# Patient Record
Sex: Male | Born: 1990 | Race: Black or African American | Hispanic: No | Marital: Single | Smoking: Never smoker
Health system: Southern US, Community
[De-identification: ages and names within clinical notes are randomized; demographics above are authoritative.]

## PROBLEM LIST (undated history)

## (undated) DIAGNOSIS — R55 Syncope and collapse: Principal | ICD-10-CM

## (undated) DIAGNOSIS — J300 Vasomotor rhinitis: Secondary | ICD-10-CM

## (undated) DIAGNOSIS — E669 Obesity, unspecified: Secondary | ICD-10-CM

## (undated) DIAGNOSIS — I440 Atrioventricular block, first degree: Secondary | ICD-10-CM

## (undated) DIAGNOSIS — R001 Bradycardia, unspecified: Secondary | ICD-10-CM

## (undated) HISTORY — DX: Syncope and collapse: R55

## (undated) HISTORY — DX: Atrioventricular block, first degree: I44.0

## (undated) HISTORY — PX: ADENOIDECTOMY: SUR15

## (undated) HISTORY — DX: Vasomotor rhinitis: J30.0

## (undated) HISTORY — DX: Obesity, unspecified: E66.9

## (undated) HISTORY — DX: Bradycardia, unspecified: R00.1

---
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
## 2012-01-03 ENCOUNTER — Telehealth: Payer: Self-pay | Admitting: Medical

## 2012-01-03 NOTE — Telephone Encounter (Signed)
Its hard to answer that question, but given the concern, I would have them come in to discuss and possibly do STD screening.   Depending upon what skin contact and where, the answer could vary.   OV please.

## 2012-01-04 ENCOUNTER — Ambulatory Visit: Payer: BC Managed Care – PPO | Admitting: Medical

## 2012-01-04 NOTE — Telephone Encounter (Signed)
PT MADE APPT

## 2012-01-14 ENCOUNTER — Ambulatory Visit (INDEPENDENT_AMBULATORY_CARE_PROVIDER_SITE_OTHER): Payer: BC Managed Care – PPO | Admitting: Medical

## 2012-01-14 ENCOUNTER — Encounter: Payer: Self-pay | Admitting: Medical

## 2012-01-14 VITALS — BP 110/70 | HR 68 | Temp 97.4°F | Resp 16 | Wt 268.0 lb

## 2012-01-14 DIAGNOSIS — Z113 Encounter for screening for infections with a predominantly sexual mode of transmission: Secondary | ICD-10-CM

## 2012-01-14 DIAGNOSIS — R55 Syncope and collapse: Secondary | ICD-10-CM

## 2012-01-14 NOTE — Progress Notes (Signed)
  Subjective:   HPI  Shawn Dean is a 21 y.o. male who presents for concern for STD screening.  He recently had sex for the first time.  He had sex on 4 different occasional and used condoms each time.  He is here for STD screening as he notes that vaginal fluid got on his body around the genitals.  He was using condom and the condom protected the penis.  He is mainly concerned about fluid that got on his lower abdomen.  He has never had STD screening before.  His partner was not a virgin.  Denies sores or open wounds. No other aggravating or relieving factors.    No other c/o.  The following portions of the patient's history were reviewed and updated as appropriate: allergies, current medications, past family history, past medical history, past social history, past surgical history and problem list.  Past Medical History  Diagnosis Date  . Allergic rhinitis   . Vasomotor rhinitis   . Obesity     No Known Allergies   Review of Systems Gen: no fever, chills, wt loss Skin: no rash GI: no NVD GU: negative ROS reviewed and was negative other than noted in HPI or above.    Objective:   Physical Exam  General appearance: alert, no distress, WD/WN Heart: RRR, normal S1, S2, no murmurs Lungs: CTA bilaterally, no wheezes, rhonchi, or rales Abdomen: +bs, soft, non tender, non distended, no masses, no hepatomegaly, no splenomegaly Pulses: 2+ symmetric, upper and lower extremities, normal cap refill GU: normal external genitalia, no mass or rash or lesion, no hernia, nontender   Assessment and Plan :     Encounter Diagnoses  Name Primary?  . Screening for STD (sexually transmitted disease) Yes  . Vasovagal episode     STD screening today - discussed screening tests, repeat HIV in 80mo, discussed safe sex, condom use, discussed different STDs and means of transmission.  Of note, a few minutes after taking his penile swab specimen, he mentioned feeling an unusual sensation  coming on and fainted.  I caught him on the way down and he became awake and conscious within seconds.  We eventually had him lie on the table.  After feeling better a while later, we had him eat and drink something.  He felt fine the rest of the visit.  Vitals fine.  He was discharged home with his mother who brought him today.

## 2012-01-15 LAB — GC/CHLAMYDIA PROBE AMP, GENITAL
Chlamydia, DNA Probe: NEGATIVE
GC Probe Amp, Genital: NEGATIVE

## 2012-01-15 LAB — RPR

## 2012-01-15 LAB — HIV ANTIBODY (ROUTINE TESTING W REFLEX): HIV: NONREACTIVE

## 2012-01-17 ENCOUNTER — Other Ambulatory Visit: Payer: Self-pay | Admitting: Medical

## 2012-01-17 MED ORDER — AMOXICILLIN 500 MG PO TABS: ORAL_TABLET | ORAL | Status: DC

## 2012-02-24 ENCOUNTER — Telehealth: Payer: Self-pay | Admitting: Medical

## 2012-02-25 ENCOUNTER — Ambulatory Visit: Payer: BC Managed Care – PPO | Admitting: Medical

## 2012-02-25 ENCOUNTER — Other Ambulatory Visit: Payer: Self-pay | Admitting: Medical

## 2012-02-25 MED ORDER — AMOXICILLIN 500 MG PO TABS: ORAL_TABLET | ORAL | Status: DC

## 2012-02-25 NOTE — Telephone Encounter (Signed)
LM

## 2013-03-02 ENCOUNTER — Encounter: Payer: Self-pay | Admitting: Medical

## 2013-03-02 ENCOUNTER — Ambulatory Visit (INDEPENDENT_AMBULATORY_CARE_PROVIDER_SITE_OTHER): Payer: BC Managed Care – PPO | Admitting: Medical

## 2013-03-02 VITALS — BP 110/80 | HR 84 | Temp 97.9°F | Resp 12 | Ht 71.0 in | Wt 310.0 lb

## 2013-03-02 DIAGNOSIS — H109 Unspecified conjunctivitis: Secondary | ICD-10-CM

## 2013-03-02 DIAGNOSIS — H7292 Unspecified perforation of tympanic membrane, left ear: Secondary | ICD-10-CM

## 2013-03-02 DIAGNOSIS — H7290 Unspecified perforation of tympanic membrane, unspecified ear: Secondary | ICD-10-CM

## 2013-03-02 DIAGNOSIS — H6690 Otitis media, unspecified, unspecified ear: Secondary | ICD-10-CM

## 2013-03-02 MED ORDER — ERYTHROMYCIN 5 MG/GM OP OINT: TOPICAL_OINTMENT | Freq: Four times a day (QID) | OPHTHALMIC | Status: DC

## 2013-03-02 MED ORDER — AMOXICILLIN 875 MG PO TABS: 875.0000 mg | ORAL_TABLET | Freq: Two times a day (BID) | ORAL | Status: DC

## 2013-03-02 NOTE — Progress Notes (Signed)
Subjective:    Shawn Dean is a 22 y.o. male who presents for evaluation of cold and eye c/o.  Has had nasal congestion and ear pressure for several days, but last few days with red eyes, eye swollen pus drainage, started with right, now in to left eye too.  Using nothing for symptoms . He works 2 jobs, Actuary, touches buggy handles, Ambulance person, etc. , where numerous other people touch.  His young niece had eye infection recently too.   He does have prior hx/o ear infection and tubes, but no prior pink eye.    ROS as in subjective  Objective: Filed Vitals:   03/02/13 1358  BP: 110/80  Pulse: 84  Temp: 97.9 F (36.6 C)  Resp: 12    General appearance: alert, no distress, WD/WN Eyes: right upper eyelid with swelling generalized, bilat conjunctiva with erythema, purulent discharge on the right, no stye, no FB, there is also crusting of eyelashes HENT: normocephalic, sclerae anicteric, right TM with effusion, left TM with effusion, slight erythema and small 2mm perforation anteriorly, nares with mild turbinated edema, purulent discharge slight erythema, pharynx normal Oral cavity: MMM, no lesions Neck: supple, no lymphadenopathy, no thyromegaly, no masses Lungs: CTA bilaterally, no wheezes, rhonchi, or rales  Assessment: Encounter Diagnoses  Name Primary?  . Conjunctivitis Yes  . Otitis media, bilateral   . Perforated tympanic membrane, left      Plan: conjunctiva - begin Emycin ointment for eyes, moist compresses, discussed hygiene, prevention, note given for work.   OM and small left perforated TM - begin amoxicillin oral, OTC Mucinex, increase water intake, and recheck 7-10 days. Handout given.

## 2013-03-02 NOTE — Patient Instructions (Addendum)
You have both a pink eye/conjunctivitis as well as ear infection today.  Begin Amoxicillin twice daily by mouth for 10 days for ear infection.     Begin erythromycin ointment, use a ribbon in each eye every 6 hours for a week.   WASH HAND frequently.  Recheck in 7-10 days.     Conjunctivitis Conjunctivitis is commonly called "pink eye." Conjunctivitis can be caused by bacterial or viral infection, allergies, or injuries. There is usually redness of the lining of the eye, itching, discomfort, and sometimes discharge. There may be deposits of matter along the eyelids. A viral infection usually causes a watery discharge, while a bacterial infection causes a yellowish, thick discharge. Pink eye is very contagious and spreads by direct contact. You may be given antibiotic eyedrops as part of your treatment. Before using your eye medicine, remove all drainage from the eye by washing gently with warm water and cotton balls. Continue to use the medication until you have awakened 2 mornings in a row without discharge from the eye. Do not rub your eye. This increases the irritation and helps spread infection. Use separate towels from other household members. Wash your hands with soap and water before and after touching your eyes. Use cold compresses to reduce pain and sunglasses to relieve irritation from light. Do not wear contact lenses or wear eye makeup until the infection is gone. SEEK MEDICAL CARE IF:   Your symptoms are not better after 3 days of treatment.  You have increased pain or trouble seeing.  The outer eyelids become very red or swollen. Document Released: 08/26/2004 Document Revised: 10/11/2011 Document Reviewed: 07/19/2005 [facility] Patient Information 2014 [place], [place].

## 2014-05-08 ENCOUNTER — Encounter: Payer: Self-pay | Admitting: Medical

## 2014-05-08 ENCOUNTER — Ambulatory Visit (INDEPENDENT_AMBULATORY_CARE_PROVIDER_SITE_OTHER): Payer: BC Managed Care – PPO | Admitting: Medical

## 2014-05-08 VITALS — BP 112/80 | HR 60 | Temp 98.4°F | Resp 18 | Wt 311.0 lb

## 2014-05-08 DIAGNOSIS — L739 Follicular disorder, unspecified: Secondary | ICD-10-CM

## 2014-05-08 DIAGNOSIS — Z23 Encounter for immunization: Secondary | ICD-10-CM

## 2014-05-08 MED ORDER — MUPIROCIN 2 % EX OINT: 1.0000 "application " | TOPICAL_OINTMENT | Freq: Three times a day (TID) | CUTANEOUS | Status: DC

## 2014-05-08 MED ORDER — CEPHALEXIN 500 MG PO CAPS: 500.0000 mg | ORAL_CAPSULE | Freq: Three times a day (TID) | ORAL | Status: DC

## 2014-05-08 NOTE — Progress Notes (Signed)
Subjective; Here for rash.  He reports few days ago clogged up a toilet with toilet paper . Had a small glove on but the nasty toilet water did contact his right forearm.  He washed his forearm off, but few hours later got some bumps and rash of right forearm that hasn't went away.  No fever, no nausea, no vomiting, no recent open wounds.  No other aggravating or relieving factors.  No other c/o.  Objective: Gen:wd, wn, nad Skin: right forearm with several raised slightly erythematous lesions, papules, 81mm diameter, no drainage, no fluctuance, there is some underlying mild patches of erythema Pulses of hand normal Sensation normal nontender arm  Assessment: Encounter Diagnoses  Name Primary?  . Folliculitis Yes  . Flu vaccine need    Plan; Folliculitis - he is UTD on tetanus and Hep B vaccines, begin Keflex oral and mupirocin ointment topically.   If new symptoms or not completely resolved in 10 days, then recheck.   Recheck sooner prn.  Counseled on the influenza virus vaccine.  Vaccine information sheet given.  Influenza vaccine given after consent obtained.

## 2014-07-29 ENCOUNTER — Encounter: Payer: Self-pay | Admitting: Medical

## 2014-07-29 ENCOUNTER — Ambulatory Visit (INDEPENDENT_AMBULATORY_CARE_PROVIDER_SITE_OTHER): Payer: BC Managed Care – PPO | Admitting: Medical

## 2014-07-29 VITALS — BP 100/70 | HR 63 | Temp 98.2°F | Wt 313.0 lb

## 2014-07-29 DIAGNOSIS — J343 Hypertrophy of nasal turbinates: Secondary | ICD-10-CM

## 2014-07-29 DIAGNOSIS — H109 Unspecified conjunctivitis: Secondary | ICD-10-CM

## 2014-07-29 MED ORDER — POLYMYXIN B-TRIMETHOPRIM 10000-0.1 UNIT/ML-% OP SOLN: 1.0000 [drp] | OPHTHALMIC | Status: DC

## 2014-07-29 NOTE — Progress Notes (Addendum)
Subjective Patient presents for evaluation of discharge, erythema and itching in the right eye. He has noticed the above symptoms for 1 day.  Onset was acute. Patient denies blurred vision, pain and photophobia. 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  doesn't wash hands frequently.   Objective: Gen: wd, wn, nad Eyes: PERRLA, EOMi, right eye with conjunctival injection, some crusting, left eye unremarkable Left nasal turbinates with hypertrophy and decreased patency hent - otherwise unremarkable   Assessment: Encounter Diagnoses  Name Primary?  . Conjunctivitis of right eye Yes  . Nasal turbinate hypertrophy    Plan: Discussed diagnosis of conjunctivitis/pink eye.  Advised that pink eye is very contagious and spreads by direct contact.  Discussed treatment including moist warm compresses, antibiotic drops, avoid rubbing eyes, do not wear contact lenses or makeup until infection is resolved.  Discussed prevention, hand washing, not rubbing eyes.  Call or return if worse or not improving.   Nasal turbinate hypertrophy - declined referral to ENT at this time.

## 2015-05-05 ENCOUNTER — Telehealth: Payer: Self-pay

## 2015-05-05 ENCOUNTER — Encounter: Payer: Self-pay | Admitting: Medical

## 2015-05-05 ENCOUNTER — Ambulatory Visit (INDEPENDENT_AMBULATORY_CARE_PROVIDER_SITE_OTHER): Payer: BLUE CROSS/BLUE SHIELD | Admitting: Medical

## 2015-05-05 VITALS — BP 100/68 | HR 60 | Temp 98.0°F | Wt 289.0 lb

## 2015-05-05 DIAGNOSIS — H1013 Acute atopic conjunctivitis, bilateral: Secondary | ICD-10-CM

## 2015-05-05 DIAGNOSIS — J309 Allergic rhinitis, unspecified: Principal | ICD-10-CM

## 2015-05-05 MED ORDER — OLOPATADINE HCL 0.2 % OP SOLN: 1.0000 [drp] | Freq: Two times a day (BID) | OPHTHALMIC | Status: DC

## 2015-05-05 MED ORDER — FEXOFENADINE HCL 180 MG PO TABS: 180.0000 mg | ORAL_TABLET | Freq: Every day | ORAL | Status: DC

## 2015-05-05 NOTE — Progress Notes (Signed)
Subjective: Chief Complaint  Patient presents with  . Pink eye    first noticed on Sunday,achey and runny eye. used some eye ointment from last time he had pink eye. asked about flu shot told him he may need to schedule that since he is here for a sick visit today   Here for pink/red eyes.   Started 1 days ago with red eyes.   Has had watery eyes, runny nose, congested nose for weeks.  He does note in the fall getting this pattern.  Thinks he has pink eye.   No sick contacts.  He had left over Emycin ointment from prior he put in left eye yesterday.  Wears contacts, but not currently using left contacts.   Left eye a little worse than right. No other aggravating or relieving factors. No other complaint.  Past Medical History  Diagnosis Date  . Allergic rhinitis   . Vasomotor rhinitis   . Obesity    ROS as in subjective    Objective: BP 100/68 mmHg  Pulse 60  Temp(Src) 98 F (36.7 C)  Wt 289 lb (131.09 kg)  Gen: wd, wn, nad Skin unremarkable bilat conjunctiva with erythema, but no drainage, no crusting Neck supple, nontender, no lymphadenopathy Nose with swollen turbinates, clear discharge, no erythema, pharynx normal Oral MMM, no lesions   Assessment: Encounter Diagnosis  Name Primary?  . Allergic conjunctivitis and rhinitis, bilateral Yes    Plan: Advised that symptoms and exam suggest allergy not infection.   Begin medications below, discussed hand washing and hygiene in general.  F/u if not improving.  Plan to c/t medication 1-2 months during fall allergy season.  Of note, he has cats in the home as well, other potential allergen triggers.    Shawn Dean was seen today for pink eye.  Diagnoses and all orders for this visit:  Allergic conjunctivitis and rhinitis, bilateral  Other orders -     Olopatadine HCl (PATADAY) 0.2 % SOLN; Apply 1 drop to eye 2 (two) times daily. -     fexofenadine (ALLEGRA) 180 MG tablet; Take 1 tablet (180 mg total) by mouth daily.

## 2015-05-05 NOTE — Telephone Encounter (Signed)
Pt called saying his prescriptions for Fexofenadine and Olopatadine were too expensive and said you told him to call back and let you know if they were.

## 2015-05-05 NOTE — Telephone Encounter (Signed)
pls call pharmacy and see what are cheaper options (prescription) instead of [place] sending one right after another medication just to get repeated denials.

## 2015-05-13 NOTE — Telephone Encounter (Signed)
Called pharmacy 3 times and stayed on the phone waiting to talk to someone for 6 minutes and couldn't get anyone

## 2015-06-02 ENCOUNTER — Encounter: Payer: Self-pay | Admitting: Family Medicine

## 2015-06-02 ENCOUNTER — Ambulatory Visit (INDEPENDENT_AMBULATORY_CARE_PROVIDER_SITE_OTHER): Payer: BLUE CROSS/BLUE SHIELD | Admitting: Family Medicine

## 2015-06-02 VITALS — BP 120/80 | HR 64 | Temp 98.1°F | Wt 292.0 lb

## 2015-06-02 DIAGNOSIS — L309 Dermatitis, unspecified: Secondary | ICD-10-CM | POA: Diagnosis not present

## 2015-06-02 NOTE — Progress Notes (Signed)
   Subjective:    Patient ID: Shawn Dean, male    DOB: 1990/11/04, 24 y.o.   MRN: [id number]  HPI He is here for a 3 day history of rash to his bilateral wrists and tops of his feet and hands. He states his mother bought a new laundry detergent and he stuck his hands into the washer with this detergent and within a few hours the tops and his hands and wrists broke out. He also reports wearing socks that were washed in the same detergent and the tops of his feet also developed a rash. Denies rash to soles of feet or palms of hands and no other parts of his body. States very little itching. Has not used anything over the counter to treat the rash. Reports history of similar outbreak after sticking his hand in toilet water and reports history of sensitive skin.  Denies fever, chills.   Reviewed allergies, medications and past medical history.   Review of Systems Pertinent positives and negatives in the history of present illness.    Objective:   Physical Exam  Ext: right and left anterior and posterior wrists, and bilateral dorsal feet up to sock line above ankles with red bumps and some scaling, no vesicles, drainage, or edema.  No rash to soles of feet or palms of hands or to interdigital spaces Good pulse, sensation, ROM.      Assessment & Plan:  Dermatitis due to unknown cause  Suspect that his rash is result of new laundry detergent. Recommend re-washing his clothing in [place], which he normally uses without difficulty and avoid contact with other detergents or cleaning agents.  He may try over-the-counter hydrocortisone cream, he has this at home. He may also try Benadryl at night if the itching is bothersome. He will let me know if no improvement in the next 2-3 days and will prescribe a higher potency steroid cream.

## 2015-06-11 ENCOUNTER — Encounter: Payer: Self-pay | Admitting: Medical

## 2015-06-11 ENCOUNTER — Ambulatory Visit (INDEPENDENT_AMBULATORY_CARE_PROVIDER_SITE_OTHER): Payer: BLUE CROSS/BLUE SHIELD | Admitting: Medical

## 2015-06-11 VITALS — BP 120/78 | HR 78 | Resp 14 | Wt 287.0 lb

## 2015-06-11 DIAGNOSIS — L209 Atopic dermatitis, unspecified: Secondary | ICD-10-CM | POA: Diagnosis not present

## 2015-06-11 MED ORDER — METHYLPREDNISOLONE 4 MG PO TABS: ORAL_TABLET | ORAL | Status: DC

## 2015-06-11 NOTE — Progress Notes (Signed)
Subjective:   Shawn Dean is a 24 y.o. male who presents for evaluation of a rash all over.  Started a little over a week ago.  He came in and saw Harland Dingwall NP last week for same.   Rash persists.  The only new exposure was detergent that was old he found around the house.   No new other exposures.   Rash is not particularly itchy but is diffuse, hands, feet, chest, arms, legs.  Patient has not had contacts with similar rash.   Used some benadryl and hydrocortisone topical cream without improvement.  No other aggravating or relieving factors. No other complaint.  The following portions of the patient's history were reviewed and updated as appropriate: allergies, current medications, past family history, past medical history, past social history and problem list.  Review of Systems As in subjective above   Objective:   Gen: wd, wn, nad Skin:   Assessment:      Encounter Diagnosis  Name Primary?  Marland Kitchen Atopic dermatitis Yes      Plan:   Discussed symptoms and exam findings.  Begin oral benadryl 2-3 times daily or Benadryl QHS with non sedating antihistamine OTC in the morning until rash resolves.   Begin medrol Dosepak.  Avoid triggers such as high sugar foods, fried foods, the old detergent which he has stopped using or other suspected triggers.   F/u prn.

## 2015-11-20 ENCOUNTER — Ambulatory Visit: Payer: BLUE CROSS/BLUE SHIELD | Admitting: Medical

## 2015-11-20 ENCOUNTER — Telehealth: Payer: Self-pay

## 2015-11-20 NOTE — Telephone Encounter (Signed)
D

## 2015-11-20 NOTE — Telephone Encounter (Signed)
This patient no showed for their appointment today.Which of the following is necessary for this patient.   A) No follow-up necessary   B) Follow-up urgent. Locate Patient Immediately.   C) Follow-up necessary. Contact patient and Schedule visit in ____ Days.   D) Follow-up Advised. Contact patient and Schedule visit in ____ Days. 

## 2015-11-24 ENCOUNTER — Encounter: Payer: Self-pay | Admitting: Medical

## 2015-11-24 ENCOUNTER — Ambulatory Visit (INDEPENDENT_AMBULATORY_CARE_PROVIDER_SITE_OTHER): Payer: BC Managed Care – PPO | Admitting: Medical

## 2015-11-24 VITALS — BP 120/82 | HR 72 | Wt 264.0 lb

## 2015-11-24 DIAGNOSIS — R55 Syncope and collapse: Secondary | ICD-10-CM | POA: Diagnosis not present

## 2015-11-24 DIAGNOSIS — Z8349 Family history of other endocrine, nutritional and metabolic diseases: Secondary | ICD-10-CM | POA: Diagnosis not present

## 2015-11-24 DIAGNOSIS — Z8249 Family history of ischemic heart disease and other diseases of the circulatory system: Secondary | ICD-10-CM

## 2015-11-24 DIAGNOSIS — Z13220 Encounter for screening for lipoid disorders: Secondary | ICD-10-CM

## 2015-11-24 DIAGNOSIS — H6122 Impacted cerumen, left ear: Secondary | ICD-10-CM

## 2015-11-24 DIAGNOSIS — Z83438 Family history of other disorder of lipoprotein metabolism and other lipidemia: Secondary | ICD-10-CM

## 2015-11-24 NOTE — Progress Notes (Addendum)
Subjective: Chief Complaint  Patient presents with  . Loss of Consciousness    when ever he sees something "grusome" 1st time was a month ago. friday happened again. did not lose conciousness for long. said he will start to feel dizzy before it happens.    Here for fainting.   Last Friday was watching video on Netflix, movie about a girl who committed suicide.  There were other people in the room at the time.   At the end of the scene the girl commits suicide.  As he watched this he fainted.  The other people in the room observed this and came to his side as he slumped over in the chair.   He was "out" for maybe a second max.   He drank some water someone gave him and a few minute latera while he was described what he saw on the movie and what he was feeling, he fainted a second time for a brief second as well.   Denies any seizure activity, and unusual feelings such as paresthesia, sweats, nausea, no vision or hearing change afterwards.  Since this was at work at the end of his shift, he sat for about 30 minutes, felt fine and went home.  He notes 1 other episode in February where he also fainting watching a horror movie.   Exercises regularly with treadmill and weights, most days per week.   Denies chest pain or SOB or feeling faint when exercising.  Denies any other episode of fainting, no chest pain, no SOB, no hx/o seizures, no family history of heart disease.  No other aggravating or relieving factors. No other complaint.  Past Medical History  Diagnosis Date  . Allergic rhinitis   . Vasomotor rhinitis   . Obesity    ROS as in subjective  Objective: BP 120/82 mmHg  Pulse 72  Wt 264 lb (119.75 kg)  General appearance: alert, no distress, WD/WN HEENT: normocephalic, sclerae anicteric, impacted cerumen on the left, otherwise TMs pearly, nares patent, no discharge or erythema, pharynx normal Oral cavity: MMM, no lesions Neck: supple, no lymphadenopathy, no thyromegaly, no masses, no  bruits Heart: RRR, normal S1, S2, no murmurs Lungs: CTA bilaterally, no wheezes, rhonchi, or rales Pulses: 2+ symmetric, upper and lower extremities, normal cap refill Neuro: CN2-12 intact, non focal exam Psych: seems to get a little upset talking about the video he watched, otherwise normal affect, answers questions appropriately    Adult ECG Report  Indication: syncope  Rate: 61 bpm  Rhythm: normal sinus rhythm  QRS Axis: 4 degrees  PR Interval: 322!  QRS Duration: 168ms  QTc: 482ms  Conduction Disturbances: first-degree A-V block   Other Abnormalities: interventricular conduction delay vs incomplete RBBB  Patient's cardiac risk factors are: male gender.  EKG comparison: none  Narrative Interpretation: prolonged PR interval and interventricular conduction delay   Assessment: Encounter Diagnoses  Name Primary?  . Vasovagal syncope Yes  . Impacted cerumen of left ear   . Family history of hyperlipidemia   . Family history of hypertension   . Screening for lipid disorders     Plan: Vasovagal syncope - strongly advised he avoid watching videos such as this or any horror movies, documentaries or other horrible things one can find online.   Labs today for further eval. reviewed EKG and case with Dr. Redmond School.  Consider consult with cardiology  screening labs today  Discussed findings.  Discussed risk/benefits of procedure and patient agrees to procedure. Successfully used warm water  lavage and curette to remove impacted cerumen from left ear canal. Patient tolerated procedure well. Advised they avoid using any cotton swabs or other devices to clean the ear canals.  Use basic hygiene as discussed.  Follow up prn.   Reviewed case with Dr. Redmond School, supervising physician.  Shawn Dean was seen today for loss of consciousness.  Diagnoses and all orders for this visit:  Vasovagal syncope -     Orthostatic vital signs -     Basic metabolic panel -     CBC with  Differential/Platelet -     Hemoglobin A1c -     Lipid panel -     EKG 12-Lead  Impacted cerumen of left ear  Family history of hyperlipidemia -     Orthostatic vital signs -     Basic metabolic panel -     CBC with Differential/Platelet -     Hemoglobin A1c -     Lipid panel -     EKG 12-Lead  Family history of hypertension -     Orthostatic vital signs -     Basic metabolic panel -     CBC with Differential/Platelet -     Hemoglobin A1c -     Lipid panel -     EKG 12-Lead  Screening for lipid disorders -     Orthostatic vital signs -     Basic metabolic panel -     CBC with Differential/Platelet -     Hemoglobin A1c -     Lipid panel -     EKG 12-Lead

## 2015-11-25 ENCOUNTER — Telehealth: Payer: Self-pay

## 2015-11-25 DIAGNOSIS — R9431 Abnormal electrocardiogram [ECG] [EKG]: Secondary | ICD-10-CM

## 2015-11-25 LAB — CBC WITH DIFFERENTIAL/PLATELET
BASOS PCT: 0 %
Basophils Absolute: 0 cells/uL (ref 0–200)
EOS PCT: 3 %
Eosinophils Absolute: 186 cells/uL (ref 15–500)
HCT: 42.7 % (ref 38.5–50.0)
HEMOGLOBIN: 14.2 g/dL (ref 13.2–17.1)
LYMPHS ABS: 2170 {cells}/uL (ref 850–3900)
Lymphocytes Relative: 35 %
MCH: 28.9 pg (ref 27.0–33.0)
MCHC: 33.3 g/dL (ref 32.0–36.0)
MCV: 87 fL (ref 80.0–100.0)
MONOS PCT: 5 %
MPV: 11 fL (ref 7.5–12.5)
Monocytes Absolute: 310 cells/uL (ref 200–950)
Neutro Abs: 3534 cells/uL (ref 1500–7800)
Neutrophils Relative %: 57 %
PLATELETS: 236 10*3/uL (ref 140–400)
RBC: 4.91 MIL/uL (ref 4.20–5.80)
RDW: 15.2 % — ABNORMAL HIGH (ref 11.0–15.0)
WBC: 6.2 10*3/uL (ref 4.0–10.5)

## 2015-11-25 LAB — HEMOGLOBIN A1C
HEMOGLOBIN A1C: 5.8 % — AB (ref <5.7)
MEAN PLASMA GLUCOSE: 120 mg/dL

## 2015-11-25 LAB — BASIC METABOLIC PANEL
BUN: 10 mg/dL (ref 7–25)
CALCIUM: 9.4 mg/dL (ref 8.6–10.3)
CHLORIDE: 104 mmol/L (ref 98–110)
CO2: 27 mmol/L (ref 20–31)
CREATININE: 0.83 mg/dL (ref 0.60–1.35)
Glucose, Bld: 88 mg/dL (ref 65–99)
Potassium: 4.4 mmol/L (ref 3.5–5.3)
SODIUM: 139 mmol/L (ref 135–146)

## 2015-11-25 LAB — LIPID PANEL
CHOLESTEROL: 129 mg/dL (ref 125–200)
HDL: 47 mg/dL (ref >=40)
LDL Cholesterol: 68 mg/dL (ref <130)
TRIGLYCERIDES: 68 mg/dL (ref <150)
Total CHOL/HDL Ratio: 2.7 Ratio (ref <=5.0)
VLDL: 14 mg/dL (ref <30)

## 2015-11-25 NOTE — Telephone Encounter (Signed)
Referred to cardio

## 2015-11-25 NOTE — Telephone Encounter (Signed)
-----   Message from Carlena Hurl, PA-C sent at 11/25/2015  8:07 AM EDT ----- Labs ok, cholesterol ok, sugar ok,lytes ok.    Let him know that we reviewed his EKG.   After discussing with Dr. Redmond School, there is an unusual electrical pattern.  It may not be significant but we'd like cardiology to give an opinion given the findings and his fainting spell.  Please refer to cardiology for a consult regarding electrical findings on the EKG.   Let him know this is to rule out any concern for abnormal heart rhythm.  Avoid watching traumatic videos, horror movies, anything negative!

## 2015-11-26 NOTE — Telephone Encounter (Signed)
Pt already had appt

## 2015-12-10 ENCOUNTER — Ambulatory Visit (INDEPENDENT_AMBULATORY_CARE_PROVIDER_SITE_OTHER): Payer: BC Managed Care – PPO | Admitting: Cardiovascular Disease

## 2015-12-10 ENCOUNTER — Encounter: Payer: Self-pay | Admitting: Cardiovascular Disease

## 2015-12-10 VITALS — BP 119/63 | HR 45 | Ht 72.0 in | Wt 273.0 lb

## 2015-12-10 DIAGNOSIS — I440 Atrioventricular block, first degree: Secondary | ICD-10-CM | POA: Insufficient documentation

## 2015-12-10 DIAGNOSIS — R001 Bradycardia, unspecified: Secondary | ICD-10-CM | POA: Diagnosis not present

## 2015-12-10 DIAGNOSIS — R55 Syncope and collapse: Secondary | ICD-10-CM

## 2015-12-10 HISTORY — DX: Atrioventricular block, first degree: I44.0

## 2015-12-10 HISTORY — DX: Bradycardia, unspecified: R00.1

## 2015-12-10 HISTORY — DX: Syncope and collapse: R55

## 2015-12-10 NOTE — Progress Notes (Signed)
Cardiology Office Note   Date:  12/10/2015   ID:  Shawn Dean, DOB [birth year], MRN [id number]  PCP:  Crisoforo Oxford, PA-C  Cardiologist:   Skeet Latch, MD   Chief Complaint  Patient presents with  . New Evaluation    abnormal Echo      History of Present Illness: Shawn Dean is a 25 y.o. male who presents for an evaluation of syncope and an abnormal EKG.  Shawn Dean was evaluated by his PCP, Chana Bode, PA-C for an episode of syncope.  The episode occurred while watching a television show about a girl committing suicide.  The episode was thought to be due to vasovagal syncope.  An EKG was obtained that showed sinus rhythm with first degree AV block.    He reports three episodes of syncope.  2 episodes occurred in the setting of watching a gruesome video.  The other occurred when he had a penile swab. Each time he continued lightheadedness and dizziness.  He thought the feeling would pass if he sat down but he subsequently passed out for a few seconds.  There was no associated room spinning, diaphoresis, nausea, palpitations, vision changes or shortness of breath.  He can always feel it right before he passes out.    Shawn Dean works out daily. He runs 2 miles on the treadmill followed by 20 minutes of stairs and often plays basketball after that. He denies any chest pain or shortness of breath with this activity. He denies any lightheadedness, dizziness, presyncope with activity. He has not noted any lower extremity edema, orthopnea or PND. He has no family history of sudden cardiac death or premature coronary artery disease. Maternal great aunt MI 26s    Past Medical History  Diagnosis Date  . Allergic rhinitis   . Vasomotor rhinitis   . Obesity   . Vasovagal syncope 12/10/2015  . Bradycardia 12/10/2015  . First degree heart block 12/10/2015    Past Surgical History  Procedure Laterality Date  . Adenoidectomy      No current  outpatient prescriptions on file.   No current facility-administered medications for this visit.    Allergies:   Review of patient's allergies indicates no known allergies.    Social History:  The patient  reports that he has never smoked. He does not have any smokeless tobacco history on file. He reports that he does not drink alcohol or use illicit drugs.   Family History:  The patient's family history includes Diabetes in his other; Hyperlipidemia in his other; Hypertension in his mother and other.    ROS:  Please see the history of present illness.   Otherwise, review of systems are positive for none.   All other systems are reviewed and negative.    PHYSICAL EXAM: VS:  BP 119/63 mmHg  Pulse 45  Ht 6' (1.829 m)  Wt 123.832 kg (273 lb)  BMI 37.02 kg/m2 , BMI Body mass index is 37.02 kg/(m^2). GENERAL:  Well appearing HEENT:  Pupils equal round and reactive, fundi not visualized, oral mucosa unremarkable NECK:  No jugular venous distention, waveform within normal limits, carotid upstroke brisk and symmetric, no bruits, no thyromegaly LYMPHATICS:  No cervical adenopathy LUNGS:  Clear to auscultation bilaterally HEART:  Bradycardic.  Regular rhythm.  PMI not displaced or sustained,S1 and S2 within normal limits, no S3, no S4, no clicks, no rubs, no murmurs ABD:  Flat, positive bowel sounds normal in frequency in pitch, no bruits, no  rebound, no guarding, no midline pulsatile mass, no hepatomegaly, no splenomegaly EXT:  2 plus pulses throughout, no edema, no cyanosis no clubbing SKIN:  No rashes no nodules NEURO:  Cranial nerves II through XII grossly intact, motor grossly intact throughout PSYCH:  Cognitively intact, oriented to person place and time   EKG:  EKG is ordered today. The ekg ordered today demonstrates sinus bradycardia. Rate 45 bpm. First degree heart block.   Recent Labs: 11/24/2015: BUN 10; Creat 0.83; Hemoglobin 14.2; Platelets 236; Potassium 4.4; Sodium 139     Lipid Panel    Component Value Date/Time   CHOL 129 11/24/2015 0001   TRIG 68 11/24/2015 0001   HDL 47 11/24/2015 0001   CHOLHDL 2.7 11/24/2015 0001   VLDL 14 11/24/2015 0001   LDLCALC 68 11/24/2015 0001      Wt Readings from Last 3 Encounters:  12/10/15 123.832 kg (273 lb)  11/24/15 119.75 kg (264 lb)  06/11/15 130.182 kg (287 lb)      ASSESSMENT AND PLAN:  # Vasovagal syncope: # Bradycardia: Shawn Dean has experienced 3 classic episodes of vasovagal syncope in the setting of painful or scary situations. We discussed the fact that this is not dangerous. The best way to prevent is to avoid these situations and 2 remain adequately hydrated. His EKG shows sinus bradycardia. He also has first degree heart block. I suspect that he has a high vagal tone. However, he is able to exercise vigorously without any symptoms. Therefore, it is very unlikely he has any chronotropic incompetence. His labs were unremarkable when checked in April. There is no indication for further testing at this time as he has no symptoms or signs of heart failure. We discussed staying hydrated, increasing salt intake, and laying down immediately if he develops a feeling of presyncope in the future.  Current medicines are reviewed at length with the patient today.  The patient does not have concerns regarding medicines.  The following changes have been made:  no change  Labs/ tests ordered today include:  No orders of the defined types were placed in this encounter.     Disposition:   FU with Hommer Cunliffe C. Oval Linsey, MD, [facility] as needed.   This note was written with the assistance of speech recognition software.  Please excuse any transcriptional errors.  Signed, Aalijah Mims C. Oval Linsey, [place], [facility]  12/10/2015 10:14 AM    [place]

## 2015-12-10 NOTE — Patient Instructions (Signed)
Medication Instructions:  Your physician recommends that you continue on your current medications as directed. Please refer to the Current Medication list given to you today.  Labwork: none  Testing/Procedures: none  Follow-Up: As needed   If you need a refill on your cardiac medications before your next appointment, please call your pharmacy.  

## 2015-12-12 ENCOUNTER — Other Ambulatory Visit: Payer: Self-pay

## 2015-12-12 ENCOUNTER — Emergency Department (HOSPITAL_COMMUNITY): Payer: BC Managed Care – PPO

## 2015-12-12 ENCOUNTER — Emergency Department (HOSPITAL_COMMUNITY)
Admission: EM | Admit: 2015-12-12 | Discharge: 2015-12-13 | Disposition: A | Discharge status: Home / Self Care | Payer: BC Managed Care – PPO | Attending: Emergency Medicine | Admitting: Emergency Medicine

## 2015-12-12 ENCOUNTER — Ambulatory Visit: Payer: BC Managed Care – PPO | Admitting: Cardiovascular Disease

## 2015-12-12 ENCOUNTER — Encounter (HOSPITAL_COMMUNITY): Payer: Self-pay | Admitting: *Deleted

## 2015-12-12 DIAGNOSIS — R110 Nausea: Secondary | ICD-10-CM | POA: Insufficient documentation

## 2015-12-12 DIAGNOSIS — E669 Obesity, unspecified: Secondary | ICD-10-CM | POA: Insufficient documentation

## 2015-12-12 DIAGNOSIS — Z8709 Personal history of other diseases of the respiratory system: Secondary | ICD-10-CM | POA: Diagnosis not present

## 2015-12-12 DIAGNOSIS — R079 Chest pain, unspecified: Secondary | ICD-10-CM | POA: Diagnosis present

## 2015-12-12 DIAGNOSIS — I252 Old myocardial infarction: Secondary | ICD-10-CM | POA: Insufficient documentation

## 2015-12-12 DIAGNOSIS — R0789 Other chest pain: Secondary | ICD-10-CM

## 2015-12-12 LAB — TROPONIN I: Troponin I: <0.03 ng/mL (ref <0.031)

## 2015-12-12 LAB — BASIC METABOLIC PANEL
ANION GAP: 8 (ref 5–15)
BUN: 11 mg/dL (ref 6–20)
CHLORIDE: 103 mmol/L (ref 101–111)
CO2: 26 mmol/L (ref 22–32)
Calcium: 9.1 mg/dL (ref 8.9–10.3)
Creatinine, Ser: 0.84 mg/dL (ref 0.61–1.24)
GFR calc Af Amer: >60 mL/min (ref >60)
Glucose, Bld: 99 mg/dL (ref 65–99)
POTASSIUM: 4 mmol/L (ref 3.5–5.1)
SODIUM: 137 mmol/L (ref 135–145)

## 2015-12-12 LAB — CBC
HEMATOCRIT: 40.3 % (ref 39.0–52.0)
HEMOGLOBIN: 14.2 g/dL (ref 13.0–17.0)
MCH: 30.2 pg (ref 26.0–34.0)
MCHC: 35.2 g/dL (ref 30.0–36.0)
MCV: 85.7 fL (ref 78.0–100.0)
Platelets: 213 10*3/uL (ref 150–400)
RBC: 4.7 MIL/uL (ref 4.22–5.81)
RDW: 13.4 % (ref 11.5–15.5)
WBC: 7.4 10*3/uL (ref 4.0–10.5)

## 2015-12-12 LAB — RAPID URINE DRUG SCREEN, HOSP PERFORMED
AMPHETAMINES: NOT DETECTED
BENZODIAZEPINES: NOT DETECTED
Barbiturates: NOT DETECTED
COCAINE: NOT DETECTED
OPIATES: NOT DETECTED
Tetrahydrocannabinol: NOT DETECTED

## 2015-12-12 LAB — I-STAT TROPONIN, ED
Troponin i, poc: 0.11 ng/mL (ref 0.00–0.08)
Troponin i, poc: 0 ng/mL (ref 0.00–0.08)

## 2015-12-12 MED ORDER — PANTOPRAZOLE SODIUM 20 MG PO TBEC: 20.0000 mg | DELAYED_RELEASE_TABLET | Freq: Two times a day (BID) | ORAL | Status: DC

## 2015-12-12 MED ORDER — NITROGLYCERIN 0.4 MG SL SUBL
0.4000 mg | SUBLINGUAL_TABLET | SUBLINGUAL | Status: DC | PRN
  Administered 2015-12-12: 0.4 mg via SUBLINGUAL
  Filled 2015-12-12: qty 1

## 2015-12-12 MED ORDER — ASPIRIN 81 MG PO CHEW
324.0000 mg | CHEWABLE_TABLET | Freq: Once | ORAL | Status: AC
  Administered 2015-12-12: 324 mg via ORAL
  Filled 2015-12-12: qty 4

## 2015-12-12 NOTE — ED Notes (Signed)
Original ekg given to schlossman, repeat per order.

## 2015-12-12 NOTE — ED Notes (Signed)
Critical troponin of 0.11  given to Dr. Billy Fischer

## 2015-12-12 NOTE — ED Notes (Signed)
Pt ambulated to xray. Per EDP, awaiting cardiology at this time

## 2015-12-12 NOTE — Progress Notes (Addendum)
Discussed over the phone and reviewed EKG on patient who presented with chest pain. EKG has abnormal borderline inferior elevation at the J point, suggestive of early repolarization, there is ST depression and T wave inversion which is 2 mm in V1. He was seen 2 days ago by Dr. Oval Linsey in the [facility] office for syncope work-up. I compared the 2 EKG's and there is possible a small increase in the J point in II, III, AVF. Troponin was drawn which has resulted elevated at 0.11 (I-STAT). I discussed this with Dr. Billy Fischer and feel that this is unlikely to be STEMI, but would advise drawing a STAT delta Troponin I in <1 hour and if the troponin increases, would start heparin and contact the STEMI doctor on call for further assessment. She suggested the patient would likely be admitted for monitoring by hospital medicine and rule-out. If the patient's clinical condition worsens or he develops dynamic EKG changes or rising ST segments, contact cardiology.  Pixie Casino, MD, [facility] Attending Cardiologist Interior

## 2015-12-12 NOTE — ED Notes (Signed)
EDP in triage room now for assessment, requested cardiology be called, notified secretary

## 2015-12-12 NOTE — ED Notes (Signed)
Pt reports onset "burning" left sided chest pain since yesterday, persisted today. Pain goes up through the left side to the top of his head. Pt reports seeing Cardiology day before yesterday for "passing out" episodes, was told his "heart rate was low" but everything else was good.

## 2015-12-12 NOTE — ED Provider Notes (Signed)
CSN: [id number]     Arrival date & time 12/12/15  1826 History   First MD Initiated Contact with Patient 12/12/15 1934     Chief Complaint  Patient presents with  . Chest Pain     (Consider location/radiation/quality/duration/timing/severity/associated sxs/prior Treatment) Patient is a 25 y.o. male presenting with chest pain.  Chest Pain Pain location:  L lateral chest Pain quality: burning   Pain radiates to:  L arm Pain radiates to the back: yes   Pain severity:  Severe Onset quality:  Gradual Duration:  2 days (began yesterday) Timing:  Constant Progression:  Unchanged Chronicity:  New Relieved by:  Nothing Exacerbated by: not exertion, not positions. Associated symptoms: nausea   Associated symptoms: no abdominal pain, no back pain, no cough, no diaphoresis, no fever, no headache, no near-syncope, no numbness, no palpitations, no shortness of breath, no syncope, not vomiting and no weakness   Risk factors: no coronary artery disease, no diabetes mellitus, no high cholesterol, no hypertension, no immobilization (1wk ago drove back from [place]), no prior DVT/PE, no smoking and no surgery   Risk factors comment:  No family hx of CAD or early death, no drug use   Past Medical History  Diagnosis Date  . Allergic rhinitis   . Vasomotor rhinitis   . Obesity   . Vasovagal syncope 12/10/2015  . Bradycardia 12/10/2015  . First degree heart block 12/10/2015   Past Surgical History  Procedure Laterality Date  . Adenoidectomy     Family History  Problem Relation Age of Onset  . Hypertension Mother   . Hypertension Other   . Hyperlipidemia Other   . Diabetes Other    Social History  Substance Use Topics  . Smoking status: Never Smoker   . Smokeless tobacco: None  . Alcohol Use: No    Review of Systems  Constitutional: Negative for fever and diaphoresis.  HENT: Negative for sore throat.   Eyes: Negative for visual disturbance.  Respiratory: Negative for cough and  shortness of breath.   Cardiovascular: Positive for chest pain. Negative for palpitations, syncope and near-syncope.  Gastrointestinal: Positive for nausea. Negative for vomiting and abdominal pain.  Genitourinary: Negative for difficulty urinating.  Musculoskeletal: Negative for back pain and neck stiffness.  Skin: Negative for rash.  Neurological: Negative for syncope, weakness, numbness and headaches.      Allergies  Review of patient's allergies indicates no known allergies.  Home Medications   Prior to Admission medications   Medication Sig Start Date End Date Taking? Authorizing Provider  pantoprazole (PROTONIX) 20 MG tablet Take 1 tablet (20 mg total) by mouth 2 (two) times daily. 12/12/15   Gareth Morgan, MD   BP 122/79 mmHg  Pulse 55  Temp(Src) 98.2 F (36.8 C) (Oral)  Resp 18  Ht 6' (1.829 m)  Wt 263 lb (119.296 kg)  BMI 35.66 kg/m2  SpO2 100% Physical Exam  Constitutional: He is oriented to person, place, and time. He appears well-developed and well-nourished. No distress.  HENT:  Head: Normocephalic and atraumatic.  Eyes: Conjunctivae and EOM are normal.  Neck: Normal range of motion.  Cardiovascular: Normal rate, regular rhythm, normal heart sounds and intact distal pulses.  Exam reveals no gallop and no friction rub.   No murmur heard. Pulmonary/Chest: Effort normal and breath sounds normal. No respiratory distress. He has no wheezes. He has no rales.  Abdominal: Soft. He exhibits no distension. There is no tenderness. There is no guarding.  Musculoskeletal: He exhibits no edema.  Neurological: He is alert and oriented to person, place, and time.  Skin: Skin is warm and dry. He is not diaphoretic.  Nursing note and vitals reviewed.   ED Course  Procedures (including critical care time) [place], ED - Abnormal; Notable for the following:    Troponin i, poc 0.11 (*)    All other components within normal limits  BASIC  METABOLIC PANEL  CBC  URINE RAPID DRUG SCREEN, HOSP PERFORMED  TROPONIN I  Randolm Idol, ED    Imaging Review Dg Chest 2 View  12/12/2015  CLINICAL DATA:  Left sided chest pain and burning sensation x2 days. No cardiopulmonary diseases. No chest surgeries. Nonsmoker. Pt shielded. EXAM: CHEST  2 VIEW COMPARISON:  12/03/2009 FINDINGS: The heart size and mediastinal contours are within normal limits. Both lungs are clear. The visualized skeletal structures are unremarkable. IMPRESSION: No active cardiopulmonary disease. Electronically Signed   By: Nolon Nations M.D.   On: 12/12/2015 19:52   I have personally reviewed and evaluated these images and lab results as part of my medical decision-making.   EKG Interpretation   Date/Time:  Friday Dec 12 2015 19:25:02 EDT Ventricular Rate:  54 PR Interval:  326 QRS Duration: 116 QT Interval:  437 QTC Calculation: 414 R Axis:   69 Text Interpretation:  Sinus rhythm Prolonged PR interval Nonspecific  intraventricular conduction delay ST depression V1 similar to prior ST  elevation in II, aVF similar to prior, benign early repolarization  Confirmed by [facility] MD, [place] ([id number]) on 12/13/2015 1:19:59 PM      MDM   Final diagnoses:  Burning chest pain   25 year old male with history of bradycardia, near-syncope for which he saw Cardiology two days ago presents with concern of chest pain. Differential diagnosis for chest pain includes pulmonary embolus, dissection, pneumothorax, pneumonia, ACS, myocarditis, pericarditis.  EKG was done and evaluate by me with ST elevations in the inferior leads which appear similar however increased from prior, and ST depression in V1 which are the same.  Given concern for possible ST elevation, called cardiology emergently to have them look at ECG. Cardiology Dr. Debara Pickett felt changes not consistent with STEMI and similar to prior.  Initially, troponin elevated on i-stat and again discussed with Cardiology,  however repeat troponin from lab normal and feel initial reading was a laboratory error on istat.  Chest x-ray was done and evaluated by me and radiology and showed no sign of pneumonia or pneumothorax. Patient is PERC negative and low risk [place], no dyspnea, no hypoxia, no tachypnea and have low suspicion for PE.  Patient is low risk HEART score and had delta troponins which were both negative. Given this evaluation, history and physical have low suspicion for pulmonary embolus, pneumonia, ACS, myocarditis, pericarditis, dissection.   Patient possibly with musculoskeletal chest pain with working out, or acid reflux. Will start protonix and recommend PCP follow up within 1 week.  Patient discharged in stable condition with understanding of reasons to return and recommend PCP follow up.   Gareth Morgan, MD 12/13/15 1325

## 2016-03-10 ENCOUNTER — Telehealth: Payer: Self-pay | Admitting: Medical

## 2016-03-10 NOTE — Telephone Encounter (Signed)
Pt called and requested that I fax over his immunization record to [facility].

## 2017-05-11 IMAGING — CR DG CHEST 2V
2 series · 2 of 2 positions shown · non-contrast
Comparison: 12/03/2009

CLINICAL DATA: Left sided chest pain and burning sensation x2 days.
No cardiopulmonary diseases. No chest surgeries. Nonsmoker. Pt
shielded.

EXAM:
CHEST  2 VIEW

[w chest pa]
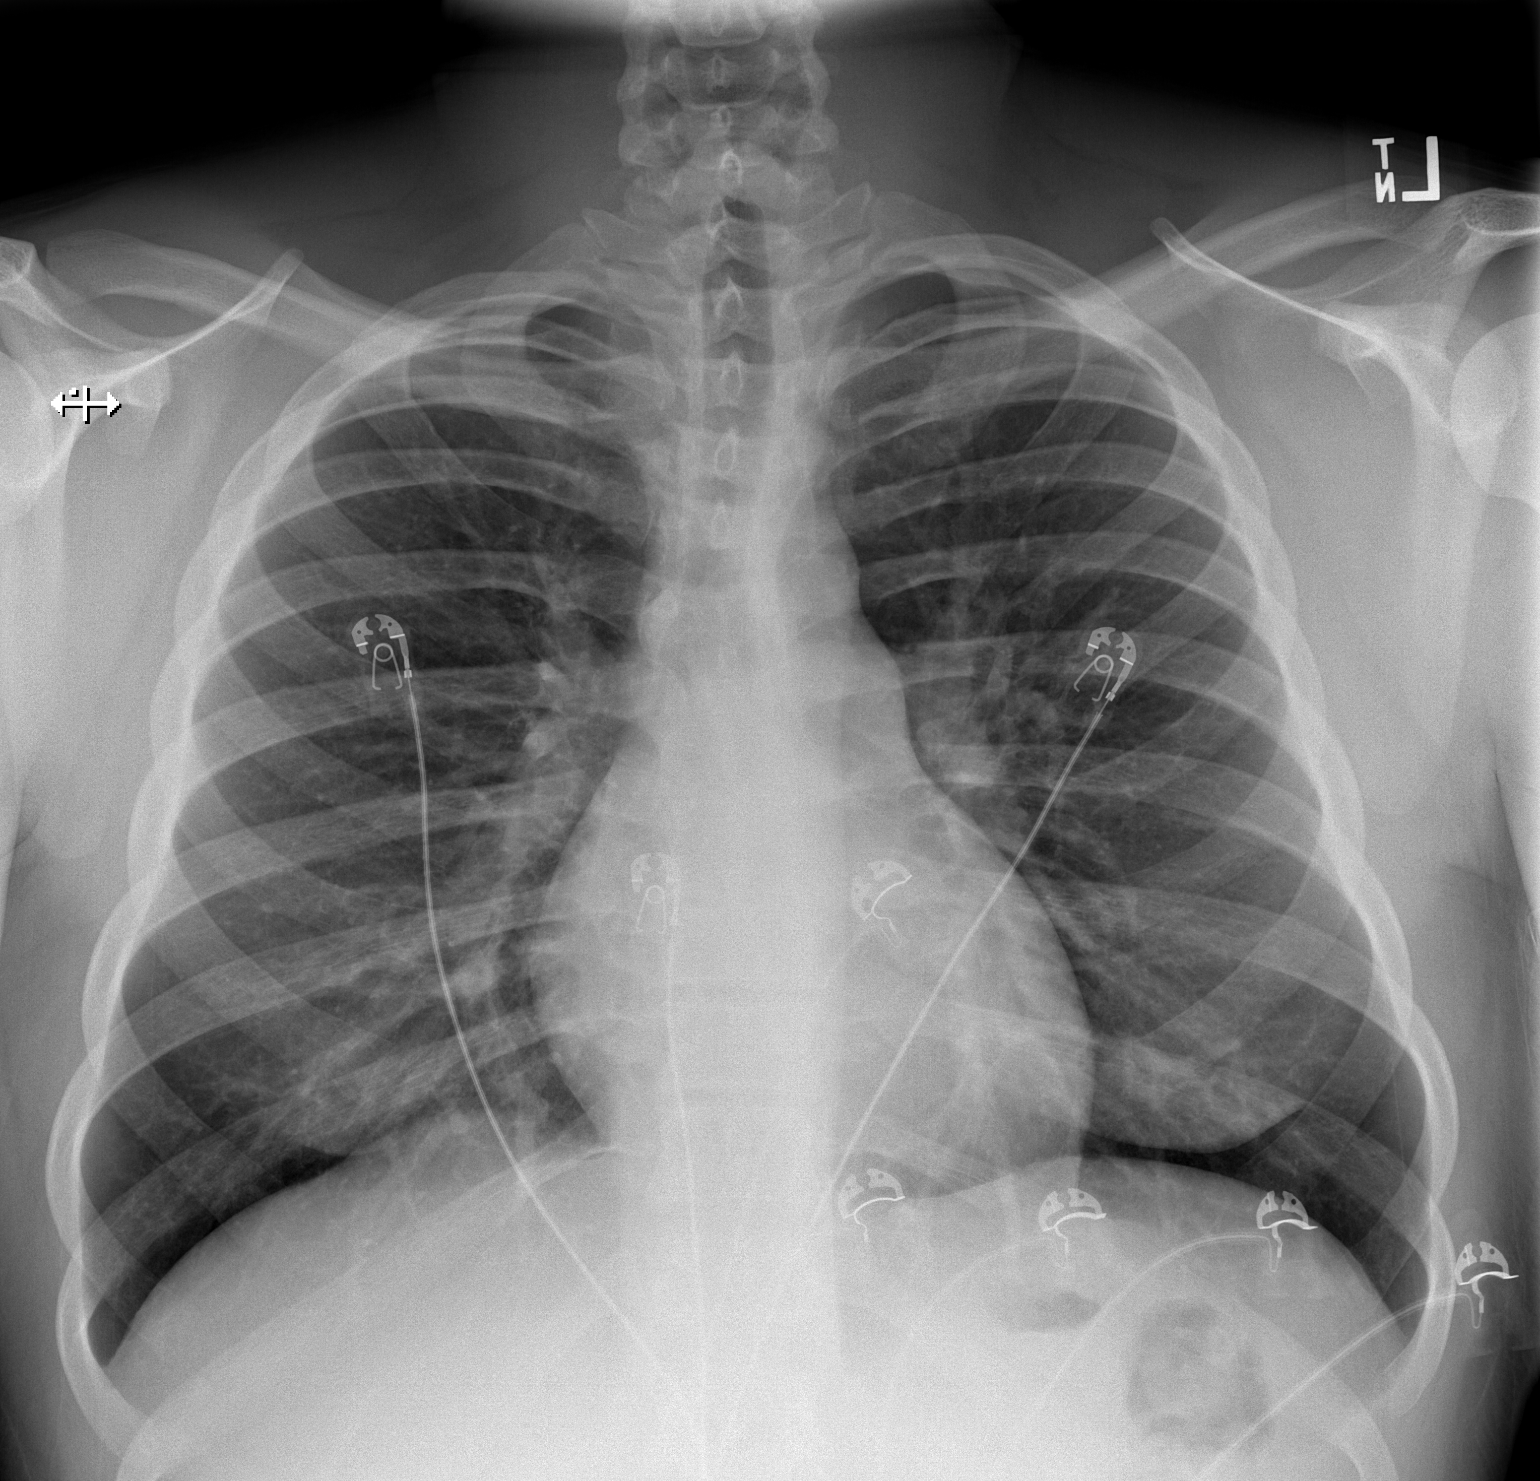

[w chest lat]
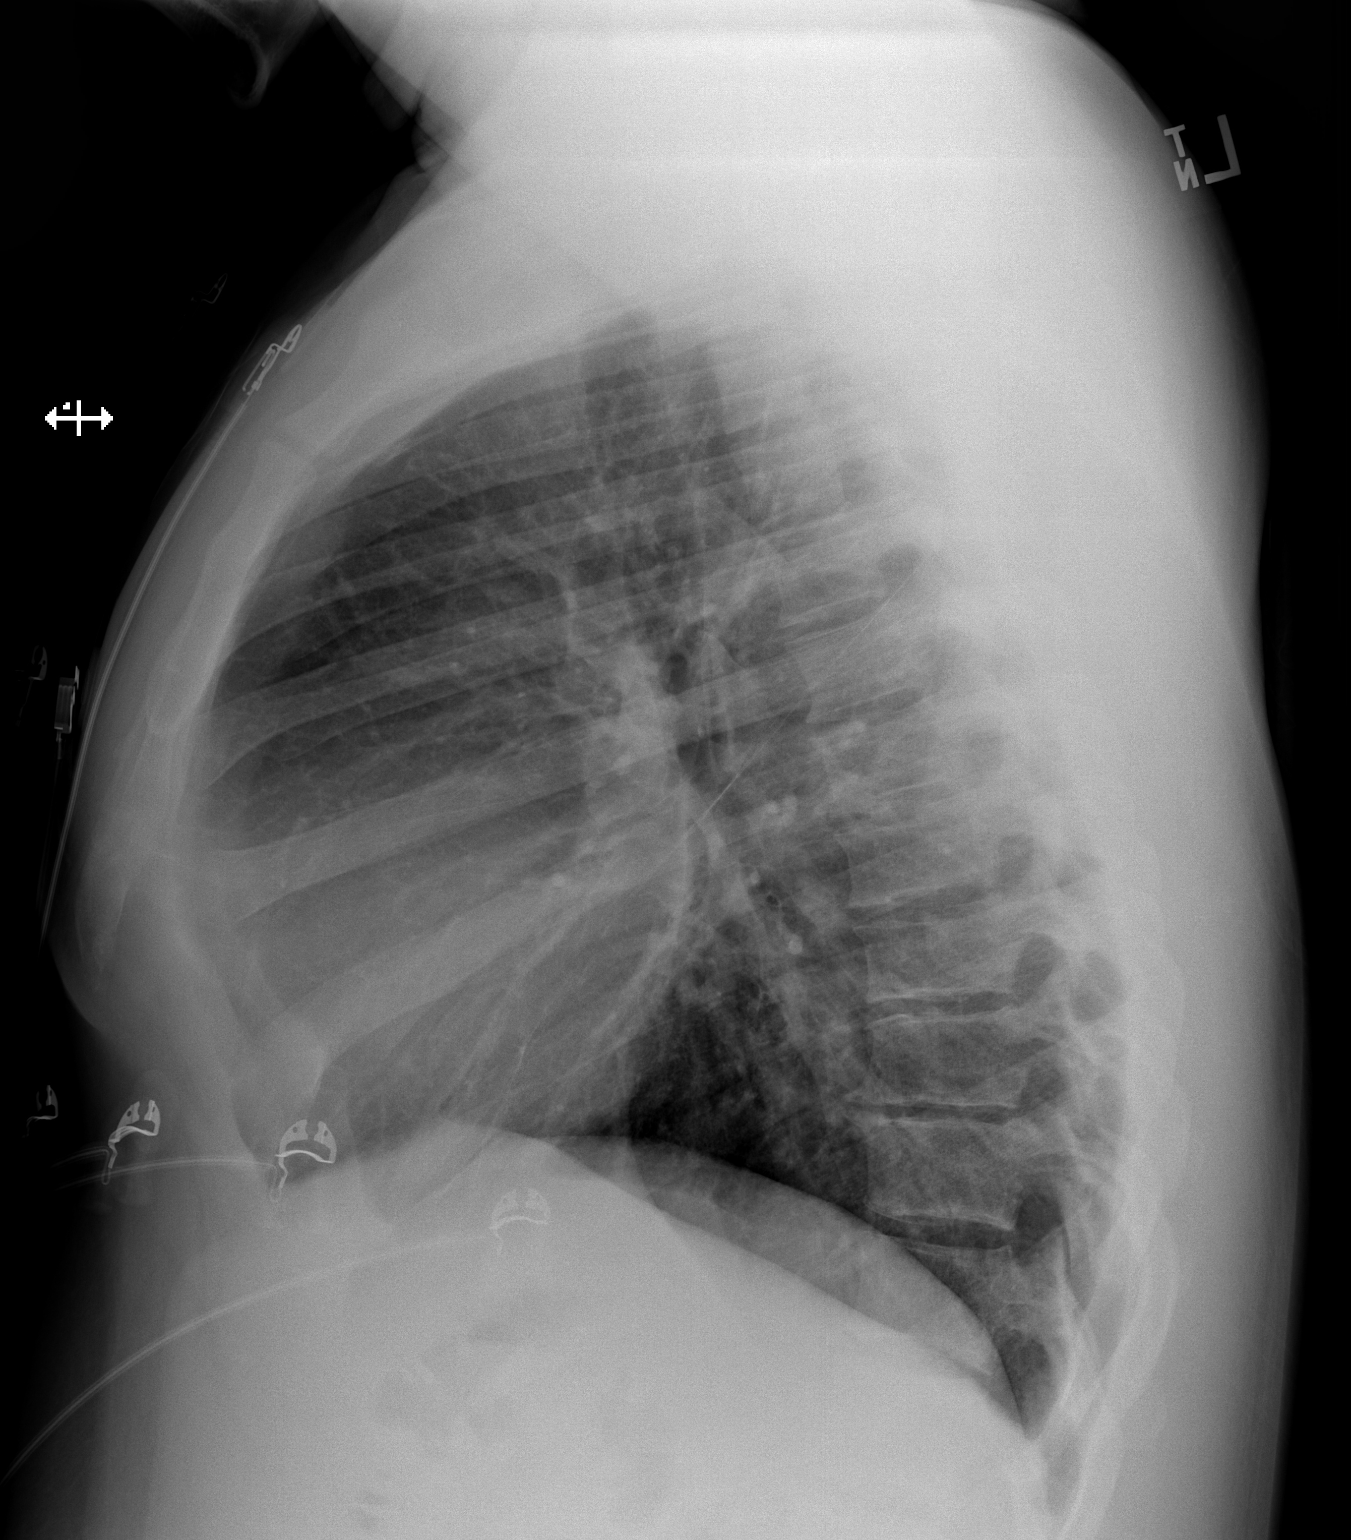

[2 of 2 positions shown; findings below may reference images not displayed]

FINDINGS: The heart size and mediastinal contours are within normal limits.
Both lungs are clear. The visualized skeletal structures are
unremarkable.
IMPRESSION: No active cardiopulmonary disease.

## 2018-03-15 ENCOUNTER — Ambulatory Visit (HOSPITAL_COMMUNITY)
Admission: EM | Admit: 2018-03-15 | Discharge: 2018-03-15 | Disposition: A | Discharge status: Home / Self Care | Payer: Managed Care, Other (non HMO) | Attending: Family Medicine | Admitting: Family Medicine

## 2018-03-15 ENCOUNTER — Encounter (HOSPITAL_COMMUNITY): Payer: Self-pay | Admitting: Emergency Medicine

## 2018-03-15 DIAGNOSIS — L42 Pityriasis rosea: Secondary | ICD-10-CM

## 2018-03-15 NOTE — ED Provider Notes (Signed)
[facility]   [id number] 03/15/18 Arrival Time: 1112  ASSESSMENT & PLAN:  1. Pityriasis rosea    Written information given. Reassured of benign nature. Will follow up with PCP or here if worsening or failing to improve as anticipated. Reviewed expectations re: course of current medical issues. Questions answered. Outlined signs and symptoms indicating need for more acute intervention. Patient verbalized understanding. After Visit Summary given.   SUBJECTIVE:  Shawn Dean is a 27 y.o. male who presents with a skin complaint.   Location: torso and back mainly Onset: gradual; over the past few weeks Pruritic? No Painful? No Progression: stable  Drainage? No  Known trigger? No  New soaps/lotions/topicals/detergents? No Environmental exposures or allergies? none Contacts with similar? No Recent travel? No  Other associated symptoms: none Therapies tried thus far: none Denies fever. No specific aggravating or alleviating factors reported.  ROS: As per HPI.  OBJECTIVE: Vitals:   03/15/18 1122  BP: 135/66  Pulse: 64  Resp: 16  Temp: 98.6 F (37 C)  TempSrc: Oral  SpO2: 100%    General appearance: alert; no distress Lungs: clear to auscultation bilaterally Heart: regular rate and rhythm Extremities: no edema Skin: warm and dry; crops of oval, sharply delimited, darker brown papules on trunk oriented along skin lines Psychological: alert and cooperative; normal mood and affect  No Known Allergies  Past Medical History:  Diagnosis Date  . Allergic rhinitis   . Bradycardia 12/10/2015  . First degree heart block 12/10/2015  . Obesity   . Vasomotor rhinitis   . Vasovagal syncope 12/10/2015   Social History   Socioeconomic History  . Marital status: Single    Spouse name: Not on file  . Number of children: Not on file  . Years of education: Not on file  . Highest education level: Not on file  Occupational History  . Not on file  Social  Needs  . Financial resource strain: Not on file  . Food insecurity:    Worry: Not on file    Inability: Not on file  . Transportation needs:    Medical: Not on file    Non-medical: Not on file  Tobacco Use  . Smoking status: Never Smoker  Substance and Sexual Activity  . Alcohol use: Yes  . Drug use: No  . Sexual activity: Not on file  Lifestyle  . Physical activity:    Days per week: Not on file    Minutes per session: Not on file  . Stress: Not on file  Relationships  . Social connections:    Talks on phone: Not on file    Gets together: Not on file    Attends religious service: Not on file    Active member of club or organization: Not on file    Attends meetings of clubs or organizations: Not on file    Relationship status: Not on file  . Intimate partner violence:    Fear of current or ex partner: Not on file    Emotionally abused: Not on file    Physically abused: Not on file    Forced sexual activity: Not on file  Other Topics Concern  . Not on file  Social History Narrative  . Not on file   Family History  Problem Relation Age of Onset  . Hypertension Mother   . Hypertension Other   . Hyperlipidemia Other   . Diabetes Other    Past Surgical History:  Procedure Laterality Date  . ADENOIDECTOMY  Vanessa Kick, MD 03/18/18 1001

## 2018-03-15 NOTE — ED Triage Notes (Signed)
Pt c/o rash on arms and leg and back for 1 month, states hes used hydrocortisone cream with some relief but it hasn't completely gone away.

## 2021-09-10 ENCOUNTER — Other Ambulatory Visit: Payer: Self-pay

## 2021-09-10 ENCOUNTER — Ambulatory Visit
Admission: EM | Admit: 2021-09-10 | Discharge: 2021-09-10 | Disposition: A | Discharge status: Home / Self Care | Payer: Managed Care, Other (non HMO)

## 2021-09-10 ENCOUNTER — Encounter: Payer: Self-pay | Admitting: Emergency Medicine

## 2021-09-10 DIAGNOSIS — D171 Benign lipomatous neoplasm of skin and subcutaneous tissue of trunk: Secondary | ICD-10-CM

## 2021-09-10 NOTE — ED Triage Notes (Signed)
Noticed two small lumps over his abdomen two weeks ago that were slightly painful. One is located to the left upper abdomen, the other is more left middle. On palpation they are firm, moveable, the upper one is non-tender, the lower one is tender, not reducible, diameters are each around the size of a nickel. Denies fevers, nausea, vomiting, diarrhea, stomach pain associated with eating, change in appetite or weight.

## 2021-09-10 NOTE — ED Provider Notes (Signed)
[facility]    CSN: [id number] Arrival date & time: 09/10/21  1158      History   Chief Complaint Chief Complaint  Patient presents with   Abdominal Pain    HPI Shawn Dean is a 31 y.o. male.   Patient here today for evaluation of two small knots to his abdomen that he first noticed a few weeks ago. He reports one is slightly tender. He denies any fever. He has not had any nausea or vomiting.   The history is provided by the patient.  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 Authorizing Provider  pantoprazole (PROTONIX) 20 MG tablet Take 1 tablet (20 mg total) by mouth 2 (two) times daily. 12/12/15   Gareth Morgan, MD    Family History Family History  Problem Relation Age of Onset   Hypertension Mother    Hypertension Other    Hyperlipidemia Other    Diabetes Other     Social History Social History   Tobacco Use   Smoking status: Never  Substance Use Topics   Alcohol use: Yes   Drug use: No     Allergies   Patient has no known allergies.   Review of Systems Review of Systems  Constitutional:  Negative for chills and fever.  Eyes:  Negative for discharge and redness.  Gastrointestinal:  Negative for abdominal pain,  diarrhea, nausea and vomiting.  Skin:  Negative for color change.    Physical Exam Triage Vital Signs ED Triage Vitals  Enc Vitals Group     BP 09/10/21 1321 133/77     Pulse Rate 09/10/21 1321 88     Resp 09/10/21 1321 16     Temp 09/10/21 1321 97.8 F (36.6 C)     Temp Source 09/10/21 1321 Oral     SpO2 09/10/21 1321 97 %     Weight --      Height --      Head Circumference --      Peak Flow --      Pain Score 09/10/21 1322 1     Pain Loc --      Pain Edu? --      Excl. in [place]? --    No data found.  Updated Vital Signs BP 133/77 (BP Location: Left Arm)    Pulse 88    Temp 97.8 F (36.6 C) (Oral)    Resp 16    SpO2 97%   Visual Acuity Right Eye Distance:   Left Eye Distance:   Bilateral Distance:    Right Eye Near:   Left Eye Near:    Bilateral Near:     Physical Exam Vitals and nursing note reviewed.  Constitutional:      General: He is not in acute distress.    Appearance: Normal appearance. He is not ill-appearing.  HENT:     Head: Normocephalic and atraumatic.  Eyes:     Conjunctiva/sclera: Conjunctivae normal.  Cardiovascular:     Rate and Rhythm: Normal rate.  Pulmonary:     Effort: Pulmonary effort is normal.  Abdominal:       Comments: Two pea sized semi mobile compressible superficial masses noted to abdomen, no erythema  Neurological:     Mental Status: He is alert.  Psychiatric:        Mood and Affect: Mood normal.        Behavior: Behavior normal.        Thought Content: Thought content normal.     UC Treatments / Results  Labs (all labs ordered are listed, but only abnormal results are displayed) Labs Reviewed - No data to display  EKG   Radiology No results found.  Procedures Procedures (including critical care time)  Medications Ordered in UC Medications - No data to display  Initial Impression / Assessment and Plan / UC Course  I have reviewed the triage vital signs and the nursing notes.  Pertinent labs & imaging  results that were available during my care of the patient were reviewed by me and considered in my medical decision making (see chart for details).    Suspect most likely lipomas- advised to continued monitoring and recommend follow up if any increase in size or development of other symptoms.   Final Clinical Impressions(s) / UC Diagnoses   Final diagnoses:  Lipoma of torso   Discharge Instructions   None    ED Prescriptions   None    PDMP not reviewed this encounter.   Francene Finders, PA-C 09/10/21 1435

## 2022-06-28 ENCOUNTER — Encounter: Payer: Self-pay | Admitting: Family Medicine

## 2022-06-28 ENCOUNTER — Ambulatory Visit (INDEPENDENT_AMBULATORY_CARE_PROVIDER_SITE_OTHER): Payer: Managed Care, Other (non HMO) | Admitting: Family Medicine

## 2022-06-28 VITALS — BP 114/77 | HR 72 | Temp 98.1°F | Resp 16 | Ht 72.0 in | Wt 376.6 lb

## 2022-06-28 DIAGNOSIS — Z0000 Encounter for general adult medical examination without abnormal findings: Secondary | ICD-10-CM

## 2022-06-28 DIAGNOSIS — Z1159 Encounter for screening for other viral diseases: Secondary | ICD-10-CM

## 2022-06-28 DIAGNOSIS — Z111 Encounter for screening for respiratory tuberculosis: Secondary | ICD-10-CM

## 2022-06-28 DIAGNOSIS — Z13220 Encounter for screening for lipoid disorders: Secondary | ICD-10-CM | POA: Diagnosis not present

## 2022-06-28 DIAGNOSIS — Z1329 Encounter for screening for other suspected endocrine disorder: Secondary | ICD-10-CM

## 2022-06-28 DIAGNOSIS — Z23 Encounter for immunization: Secondary | ICD-10-CM

## 2022-06-28 DIAGNOSIS — Z6841 Body Mass Index (BMI) 40.0 and over, adult: Secondary | ICD-10-CM

## 2022-06-28 DIAGNOSIS — Z130 Encounter for screening for diseases of the blood and blood-forming organs and certain disorders involving the immune mechanism: Secondary | ICD-10-CM

## 2022-06-28 DIAGNOSIS — Z13228 Encounter for screening for other metabolic disorders: Secondary | ICD-10-CM

## 2022-06-28 NOTE — Progress Notes (Signed)
New Patient Office Visit  Subjective    Patient ID: Shawn Dean, male    DOB: 1990/12/29  Age: 31 y.o. MRN: [id number]  CC:  Chief Complaint  Patient presents with   Establish Care    HPI Shawn Dean presents to establish care and for routine annual exam. Patient denies acute complaints.    Outpatient Encounter Medications as of 06/28/2022  Medication Sig   [DISCONTINUED] pantoprazole (PROTONIX) 20 MG tablet Take 1 tablet (20 mg total) by mouth 2 (two) times daily.   No facility-administered encounter medications on file as of 06/28/2022.    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   First degree heart block 12/10/2015   Obesity    Vasomotor rhinitis    Vasovagal syncope 12/10/2015    Past Surgical History:  Procedure Laterality Date   ADENOIDECTOMY      Family History  Problem Relation Age of Onset   Hypertension Mother    Hypertension Other    Hyperlipidemia Other    Diabetes Other     Social History   Socioeconomic History   Marital status: Single    Spouse name: Not on file   Number of children: Not on file   Years of education: Not on file   Highest education level: Not on file  Occupational History   Not on file  Tobacco Use   Smoking status: Never   Smokeless tobacco: Not on file  Substance and Sexual Activity   Alcohol use: Yes    Alcohol/week: 1.0 standard drink of alcohol    Types: 1 Shots of liquor per week   Drug use: No   Sexual activity: Not Currently  Other Topics Concern   Not on file  Social History Narrative   Not on file   Social Determinants of Health   Financial Resource Strain: Not on file  Food Insecurity: Not on file  Transportation Needs: Not on file  Physical Activity: Not on file  Stress: Not on file  Social Connections: Not on file  Intimate Partner Violence: Not on file    Review of Systems  All other systems reviewed and are negative.        Objective    BP 114/77   Pulse 72   Temp 98.1 F (36.7 C) (Oral)   Resp 16   Ht 6' (1.829 m)   Wt (!) 376 lb 9.6 oz (170.8 kg)   SpO2 93%   BMI 51.08 kg/m   Physical Exam Vitals and nursing note reviewed.  Constitutional:      General: He is not in acute distress.    Appearance: He is obese.  HENT:     Head: Normocephalic and atraumatic.     Right Ear: Tympanic membrane, ear canal and external ear normal.     Left Ear: Tympanic membrane, ear canal and external ear normal.     Nose: Nose normal.     Mouth/Throat:     Mouth: Mucous membranes are moist.     Pharynx: Oropharynx is clear.  Eyes:     Conjunctiva/sclera: Conjunctivae normal.     Pupils: Pupils are equal, round, and reactive to light.  Neck:     Thyroid: No thyromegaly.  Cardiovascular:     Rate and Rhythm: Normal rate and regular rhythm.     Heart sounds: Normal heart sounds. No murmur heard. Pulmonary:     Effort: Pulmonary effort is normal.  Breath sounds: Normal breath sounds.  Abdominal:     General: There is no distension.     Palpations: Abdomen is soft. There is no mass.     Tenderness: There is no abdominal tenderness.     Hernia: There is no hernia in the left inguinal area or right inguinal area.  Genitourinary:    Penis: Normal and circumcised.      Testes: Normal.  Musculoskeletal:        General: Normal range of motion.     Cervical back: Normal range of motion and neck supple.     Right lower leg: No edema.     Left lower leg: No edema.  Skin:    General: Skin is warm and dry.  Neurological:     General: No focal deficit present.     Mental Status: He is alert and oriented to person, place, and time. Mental status is at baseline.  Psychiatric:        Mood and Affect: Mood normal.        Behavior: Behavior normal.         Assessment & Plan:   Problem List Items Addressed This Visit   None Visit Diagnoses     Need for tetanus booster    -  Primary   Relevant Orders    Tdap vaccine greater than or equal to 7yo IM (Completed)   Annual physical exam       Relevant Orders   CMP14+EGFR   Screening-pulmonary TB       Relevant Orders   QuantiFERON-TB Gold Plus   Screening for deficiency anemia       Relevant Orders   CBC with Differential   Screening for lipid disorders       Relevant Orders   Lipid Panel   Screening for endocrine/metabolic/immunity disorders       Relevant Orders   TSH   Hemoglobin A1c   Need for hepatitis C screening test       Relevant Orders   Hepatitis C Antibody       No follow-ups on file.   Becky Sax, MD

## 2022-06-29 LAB — CMP14+EGFR
ALT: 17 IU/L (ref 0–44)
AST: 18 IU/L (ref 0–40)
Albumin/Globulin Ratio: 1.6 (ref 1.2–2.2)
Albumin: 4.6 g/dL (ref 4.3–5.2)
Alkaline Phosphatase: 50 IU/L (ref 44–121)
BUN/Creatinine Ratio: 13 (ref 9–20)
BUN: 14 mg/dL (ref 6–20)
Bilirubin Total: 0.7 mg/dL (ref 0.0–1.2)
CO2: 24 mmol/L (ref 20–29)
Calcium: 9.9 mg/dL (ref 8.7–10.2)
Chloride: 101 mmol/L (ref 96–106)
Creatinine, Ser: 1.04 mg/dL (ref 0.76–1.27)
Globulin, Total: 2.8 g/dL (ref 1.5–4.5)
Glucose: 86 mg/dL (ref 70–99)
Potassium: 4.5 mmol/L (ref 3.5–5.2)
Sodium: 138 mmol/L (ref 134–144)
Total Protein: 7.4 g/dL (ref 6.0–8.5)
eGFR: 100 mL/min/{1.73_m2} (ref >59)

## 2022-06-29 LAB — CBC WITH DIFFERENTIAL/PLATELET
Basophils Absolute: 0 10*3/uL (ref 0.0–0.2)
Basos: 1 %
EOS (ABSOLUTE): 0.3 10*3/uL (ref 0.0–0.4)
Eos: 4 %
Hematocrit: 45.7 % (ref 37.5–51.0)
Hemoglobin: 15.1 g/dL (ref 13.0–17.7)
Immature Grans (Abs): 0 10*3/uL (ref 0.0–0.1)
Immature Granulocytes: 0 %
Lymphocytes Absolute: 2.9 10*3/uL (ref 0.7–3.1)
Lymphs: 43 %
MCH: 28.7 pg (ref 26.6–33.0)
MCHC: 33 g/dL (ref 31.5–35.7)
MCV: 87 fL (ref 79–97)
Monocytes Absolute: 0.3 10*3/uL (ref 0.1–0.9)
Monocytes: 4 %
Neutrophils Absolute: 3.3 10*3/uL (ref 1.4–7.0)
Neutrophils: 48 %
Platelets: 248 10*3/uL (ref 150–450)
RBC: 5.27 x10E6/uL (ref 4.14–5.80)
RDW: 13.8 % (ref 11.6–15.4)
WBC: 6.8 10*3/uL (ref 3.4–10.8)

## 2022-06-29 LAB — LIPID PANEL
Chol/HDL Ratio: 3.8 ratio (ref 0.0–5.0)
Cholesterol, Total: 171 mg/dL (ref 100–199)
HDL: 45 mg/dL (ref >39)
LDL Chol Calc (NIH): 110 mg/dL — ABNORMAL HIGH (ref 0–99)
Triglycerides: 88 mg/dL (ref 0–149)
VLDL Cholesterol Cal: 16 mg/dL (ref 5–40)

## 2022-06-29 LAB — HEMOGLOBIN A1C
Est. average glucose Bld gHb Est-mCnc: 126 mg/dL
Hgb A1c MFr Bld: 6 % — ABNORMAL HIGH (ref 4.8–5.6)

## 2022-06-29 LAB — HEPATITIS C ANTIBODY: Hep C Virus Ab: NONREACTIVE

## 2022-06-29 LAB — TSH: TSH: 2.27 u[IU]/mL (ref 0.450–4.500)

## 2022-06-30 ENCOUNTER — Other Ambulatory Visit: Payer: Managed Care, Other (non HMO)

## 2022-07-04 LAB — QUANTIFERON-TB GOLD PLUS
QuantiFERON Mitogen Value: >10 IU/mL
QuantiFERON Nil Value: 0.03 IU/mL
QuantiFERON TB1 Ag Value: 0.04 IU/mL
QuantiFERON TB2 Ag Value: 0.06 IU/mL
QuantiFERON-TB Gold Plus: NEGATIVE

## 2022-08-05 ENCOUNTER — Ambulatory Visit: Payer: Managed Care, Other (non HMO) | Admitting: Dietician

## 2022-08-09 ENCOUNTER — Telehealth: Payer: Self-pay | Admitting: Family Medicine

## 2022-08-09 NOTE — Telephone Encounter (Signed)
Form is in provider folder

## 2022-08-09 NOTE — Telephone Encounter (Signed)
Pt inquiring about a form regarding a physical he dropped off on Thursday w/ [employer] for employment) and asked to be informed via phone call when available for pick up.

## 2022-08-11 ENCOUNTER — Telehealth: Payer: Self-pay | Admitting: *Deleted

## 2022-08-11 NOTE — Telephone Encounter (Signed)
Patient was called to pick up paperwork .

## 2024-02-06 ENCOUNTER — Telehealth: Payer: Self-pay | Admitting: Family Medicine

## 2024-02-06 ENCOUNTER — Telehealth: Payer: Self-pay

## 2024-02-06 NOTE — Telephone Encounter (Signed)
 Called patient unable to make contact, voicemail has been left   Patient needs Hep B and 2nd Varicella to complete records

## 2024-02-06 NOTE — Telephone Encounter (Signed)
 Copied from CRM [id number]. Topic: Clinical - Medical Advice >> Feb 02, 2024  2:12 PM [name] wrote: Reason for CRM: patient called.. had questions about shots he has or needs for school.. he said school is not picking up.Shawn Dean

## 2024-02-06 NOTE — Telephone Encounter (Signed)
 Is pt updated with vaccines? Please advise

## 2024-02-06 NOTE — Telephone Encounter (Signed)
 Copied from CRM [id number]. Topic: Appointments - Appointment Scheduling >> Feb 06, 2024 12:06 PM [name] wrote: Patient returned our call.. I did schedule Hep B.. can we do 2nd Varicella to complete records?

## 2024-02-08 NOTE — Telephone Encounter (Signed)
 Can have both vaccines (hep b and varicella)

## 2024-02-08 NOTE — Telephone Encounter (Signed)
 Called patient unable to make contact, voicemail has been left

## 2024-02-10 ENCOUNTER — Ambulatory Visit (INDEPENDENT_AMBULATORY_CARE_PROVIDER_SITE_OTHER): Admitting: *Deleted

## 2024-02-10 DIAGNOSIS — Z23 Encounter for immunization: Secondary | ICD-10-CM | POA: Diagnosis not present

## 2024-02-10 NOTE — Progress Notes (Signed)
  Heplisav-B  (HepB-CPG) Vaccine              Varicella vaccine subcutaneous       Patient is in office today for a nurse visit for Immunization.

## 2024-03-15 ENCOUNTER — Ambulatory Visit (INDEPENDENT_AMBULATORY_CARE_PROVIDER_SITE_OTHER): Admitting: Family Medicine

## 2024-03-15 ENCOUNTER — Encounter: Payer: Self-pay | Admitting: Family Medicine

## 2024-03-15 VITALS — BP 126/82 | HR 71 | Ht 72.0 in | Wt 371.2 lb

## 2024-03-15 DIAGNOSIS — Z6841 Body Mass Index (BMI) 40.0 and over, adult: Secondary | ICD-10-CM | POA: Diagnosis not present

## 2024-03-15 DIAGNOSIS — R0683 Snoring: Secondary | ICD-10-CM

## 2024-03-15 DIAGNOSIS — E66813 Obesity, class 3: Secondary | ICD-10-CM | POA: Diagnosis not present

## 2024-03-19 ENCOUNTER — Encounter: Payer: Self-pay | Admitting: Family Medicine

## 2024-03-19 NOTE — Progress Notes (Signed)
   Established Patient Office Visit  Subjective    Patient ID: Shawn Dean, male    DOB: January 15, 1991  Age: 33 y.o. MRN: [id number]  CC:  Chief Complaint  Patient presents with   Snoring    Pt would like a sleep study due to him snoring, his sleep gets interupted, and his father has sleep apnea     HPI Shawn Dean presents with concerns of snoring with possible sleep apnea. Patient reports his father also has sleep apnea. Patient denies acute complaints.   No outpatient encounter medications on file as of 03/15/2024.   No facility-administered encounter medications on file as of 03/15/2024.    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   First degree heart block 12/10/2015   Obesity    Vasomotor rhinitis    Vasovagal syncope 12/10/2015    Past Surgical History:  Procedure Laterality Date   ADENOIDECTOMY      Family History  Problem Relation Age of Onset   Hypertension Mother    Hypertension Other    Hyperlipidemia Other    Diabetes Other     Social History   Socioeconomic History   Marital status: Single    Spouse name: Not on file   Number of children: Not on file   Years of education: Not on file   Highest education level: Not on file  Occupational History   Not on file  Tobacco Use   Smoking status: Never   Smokeless tobacco: Not on file  Substance and Sexual Activity   Alcohol use: Yes    Alcohol/week: 1.0 standard drink of alcohol    Types: 1 Shots of liquor per week   Drug use: No   Sexual activity: Not Currently  Other Topics Concern   Not on file  Social History Narrative   Not on file   Social Drivers of Health   Financial Resource Strain: Not on file  Food Insecurity: Not on file  Transportation Needs: Not on file  Physical Activity: Not on file  Stress: Not on file  Social Connections: Not on file  Intimate Partner Violence: Not on file    Review of Systems  All other  systems reviewed and are negative.       Objective    BP 126/82   Pulse 71   Ht 6' (1.829 m)   Wt (!) 371 lb 3.2 oz (168.4 kg)   SpO2 96%   BMI 50.34 kg/m   Physical Exam Vitals and nursing note reviewed.  Constitutional:      General: He is not in acute distress.    Appearance: He is obese.  Cardiovascular:     Rate and Rhythm: Normal rate and regular rhythm.  Pulmonary:     Effort: Pulmonary effort is normal.     Breath sounds: Normal breath sounds.  Abdominal:     Palpations: Abdomen is soft.  Neurological:     General: No focal deficit present.     Mental Status: He is alert and oriented to person, place, and time.         Assessment & Plan:   Snores -     PSG Sleep Study; Future  Class 3 severe obesity due to excess calories without serious comorbidity with body mass index (BMI) of 50.0 to 59.9 in adult     Return if symptoms worsen or fail to improve.   Shawn Raguel SQUIBB, MD

## 2024-07-02 ENCOUNTER — Telehealth: Payer: Self-pay

## 2024-07-02 NOTE — Telephone Encounter (Signed)
 Tried to call Shawn Dean back at number provided but no answer. Left detailed message with call back number if needed.

## 2024-07-02 NOTE — Telephone Encounter (Signed)
 Copied from CRM #[id number]. Topic: General - Other >> Jul 02, 2024  9:22 AM Antwanette L wrote: Reason for CRM: Veva is calling from [facility]  said [employer] denied the in lab sleep study  but they will approve home sleep study.Veva is requesting a callback at [phone]
